# Patient Record
Sex: Female | Born: 1975 | Hispanic: Yes | Marital: Married | Smoking: Never smoker
Health system: Southern US, Community
[De-identification: ages and names within clinical notes are randomized; demographics above are authoritative.]

---

## 2009-11-24 ENCOUNTER — Inpatient Hospital Stay (HOSPITAL_COMMUNITY): Admission: AD | Admit: 2009-11-24 | Discharge: 2009-11-24 | Payer: Self-pay | Admitting: Obstetrics & Gynecology

## 2010-02-14 ENCOUNTER — Ambulatory Visit (HOSPITAL_COMMUNITY)
Admission: RE | Admit: 2010-02-14 | Discharge: 2010-02-14 | Payer: Self-pay | Source: Home / Self Care | Admitting: Family Medicine

## 2010-05-23 LAB — CBC
MCH: 30.4 pg (ref 26.0–34.0)
MCV: 89.9 fL (ref 78.0–100.0)
Platelets: 239 10*3/uL (ref 150–400)
RBC: 4.1 MIL/uL (ref 3.87–5.11)
RDW: 13.9 % (ref 11.5–15.5)
WBC: 12.7 10*3/uL — ABNORMAL HIGH (ref 4.0–10.5)

## 2010-05-23 LAB — WET PREP, GENITAL
Trich, Wet Prep: NONE SEEN
Yeast Wet Prep HPF POC: NONE SEEN

## 2010-05-23 LAB — URINE CULTURE
Colony Count: NO GROWTH
Culture: NO GROWTH

## 2010-05-23 LAB — GC/CHLAMYDIA PROBE AMP, GENITAL: Chlamydia, DNA Probe: NEGATIVE

## 2010-05-23 LAB — URINALYSIS, ROUTINE W REFLEX MICROSCOPIC
Bilirubin Urine: NEGATIVE
Glucose, UA: NEGATIVE mg/dL
Protein, ur: NEGATIVE mg/dL
Specific Gravity, Urine: 1.02 (ref 1.005–1.030)
Urobilinogen, UA: 0.2 mg/dL (ref 0.0–1.0)

## 2010-05-23 LAB — URINE MICROSCOPIC-ADD ON

## 2010-06-28 ENCOUNTER — Inpatient Hospital Stay (HOSPITAL_COMMUNITY): Admission: AD | Admit: 2010-06-28 | Payer: Self-pay | Source: Home / Self Care | Admitting: Obstetrics & Gynecology

## 2010-06-30 ENCOUNTER — Inpatient Hospital Stay (HOSPITAL_COMMUNITY)
Admission: AD | Admit: 2010-06-30 | Discharge: 2010-07-01 | DRG: 775 | Disposition: A | Discharge status: Home / Self Care | Payer: Managed Care, Other (non HMO) | Source: Ambulatory Visit | Attending: Obstetrics & Gynecology | Admitting: Obstetrics & Gynecology

## 2010-06-30 LAB — CBC
Hemoglobin: 10.3 g/dL — ABNORMAL LOW (ref 12.0–15.0)
MCHC: 31.1 g/dL (ref 30.0–36.0)
MCV: 80.9 fL (ref 78.0–100.0)
Platelets: 257 10*3/uL (ref 150–400)
WBC: 13.6 10*3/uL — ABNORMAL HIGH (ref 4.0–10.5)

## 2012-02-27 ENCOUNTER — Emergency Department (HOSPITAL_BASED_OUTPATIENT_CLINIC_OR_DEPARTMENT_OTHER)
Admission: EM | Admit: 2012-02-27 | Discharge: 2012-02-27 | Disposition: A | Discharge status: Home / Self Care | Payer: Worker's Compensation | Attending: Emergency Medicine | Admitting: Emergency Medicine

## 2012-02-27 ENCOUNTER — Encounter (HOSPITAL_BASED_OUTPATIENT_CLINIC_OR_DEPARTMENT_OTHER): Payer: Self-pay

## 2012-02-27 DIAGNOSIS — Y939 Activity, unspecified: Secondary | ICD-10-CM | POA: Insufficient documentation

## 2012-02-27 DIAGNOSIS — Z23 Encounter for immunization: Secondary | ICD-10-CM | POA: Insufficient documentation

## 2012-02-27 DIAGNOSIS — S1093XA Contusion of unspecified part of neck, initial encounter: Secondary | ICD-10-CM | POA: Insufficient documentation

## 2012-02-27 DIAGNOSIS — S0083XA Contusion of other part of head, initial encounter: Secondary | ICD-10-CM

## 2012-02-27 DIAGNOSIS — S0181XA Laceration without foreign body of other part of head, initial encounter: Secondary | ICD-10-CM

## 2012-02-27 DIAGNOSIS — Y990 Civilian activity done for income or pay: Secondary | ICD-10-CM | POA: Insufficient documentation

## 2012-02-27 DIAGNOSIS — Y9269 Other specified industrial and construction area as the place of occurrence of the external cause: Secondary | ICD-10-CM | POA: Insufficient documentation

## 2012-02-27 DIAGNOSIS — W1809XA Striking against other object with subsequent fall, initial encounter: Secondary | ICD-10-CM | POA: Insufficient documentation

## 2012-02-27 DIAGNOSIS — S0003XA Contusion of scalp, initial encounter: Secondary | ICD-10-CM | POA: Insufficient documentation

## 2012-02-27 DIAGNOSIS — W010XXA Fall on same level from slipping, tripping and stumbling without subsequent striking against object, initial encounter: Secondary | ICD-10-CM | POA: Insufficient documentation

## 2012-02-27 DIAGNOSIS — S0120XA Unspecified open wound of nose, initial encounter: Secondary | ICD-10-CM | POA: Insufficient documentation

## 2012-02-27 MED ORDER — TETANUS-DIPHTH-ACELL PERTUSSIS 5-2.5-18.5 LF-MCG/0.5 IM SUSP
0.5000 mL | Freq: Once | INTRAMUSCULAR | Status: AC
  Administered 2012-02-27: 0.5 mL via INTRAMUSCULAR
  Filled 2012-02-27: qty 0.5

## 2012-02-27 NOTE — ED Notes (Signed)
Pt fell at work striking face on floor.  Laceration to nose and hematoma to forehead.

## 2012-02-27 NOTE — ED Provider Notes (Addendum)
History     CSN: [id number]  Arrival date & time 02/27/12  1032   First MD Initiated Contact with Patient 02/27/12 1034      Chief Complaint  Patient presents with  . Fall  . Facial Laceration  . Head Injury    (Consider location/radiation/quality/duration/timing/severity/associated sxs/prior treatment) Patient is a 36 y.o. female presenting with fall and head injury. The history is provided by the patient.  Fall The accident occurred less than 1 hour ago. Incident: while at work she slipped and fell forward hiting her face on a shelf. She fell from a height of 1 to 2 ft. She landed on a hard floor. The volume of blood lost was minimal. Point of impact: face and forehead. Pain location: face and forehead. The pain is at a severity of 3/10. The pain is mild. She was ambulatory at the scene. Pertinent negatives include no visual change, no numbness, no nausea, no vomiting, no loss of consciousness and no tingling. The symptoms are aggravated by pressure on the injury. She has tried nothing for the symptoms. The treatment provided no relief.  Head Injury  Pertinent negatives include no numbness and no vomiting.    No past medical history on file.  No past surgical history on file.  No family history on file.  History  Substance Use Topics  . Smoking status: Not on file  . Smokeless tobacco: Not on file  . Alcohol Use: Not on file    OB History    No data available      Review of Systems  Gastrointestinal: Negative for nausea and vomiting.  Neurological: Negative for tingling, loss of consciousness and numbness.  All other systems reviewed and are negative.    Allergies  Review of patient's allergies indicates not on file.  Home Medications  No current outpatient prescriptions on file.  There were no vitals taken for this visit.  Physical Exam  Nursing note and vitals reviewed. Constitutional: She is oriented to person, place, and time. She appears  well-developed and well-nourished. No distress.  HENT:  Head: Normocephalic. Head is with contusion.    Nose: Nose lacerations present. No septal deviation or nasal septal hematoma.  Mouth/Throat: Oropharynx is clear and moist.  Eyes: Conjunctivae normal and EOM are normal. Pupils are equal, round, and reactive to light.  Neck: Normal range of motion. Neck supple. No spinous process tenderness and no muscular tenderness present.  Pulmonary/Chest: Effort normal. She exhibits no tenderness.  Abdominal: Soft. She exhibits no distension. There is no tenderness. There is no rebound and no guarding.  Musculoskeletal: Normal range of motion. She exhibits no edema and no tenderness.  Neurological: She is alert and oriented to person, place, and time.  Skin: Skin is warm and dry. No rash noted. No erythema.  Psychiatric: She has a normal mood and affect. Her behavior is normal.    ED Course  Procedures (including critical care time)  Labs Reviewed - No data to display No results found.  LACERATION REPAIR Performed by: Gwyneth Sprout Authorized byGwyneth Sprout Consent: Verbal consent obtained. Risks and benefits: risks, benefits and alternatives were discussed Consent given by: patient Patient identity confirmed: provided demographic data Prepped and Draped in normal sterile fashion Wound explored  Laceration Location: Nasal bridge  Laceration Length: 2 cm  No Foreign Bodies seen or palpated  Anesthesia: None   Irrigation method: syringe Amount of cleaning: standard  Skin closure: Dermabond   Number of sutures: 0   Technique: Dermabond  Patient tolerance: Patient tolerated the procedure well with no immediate complications.   1. Facial laceration   2. Facial hematoma       MDM   Patient with a mechanical fall at work with a laceration to her face. No LOC. Patient is otherwise healthy and takes no medications. She has no neck tenderness and her exam is  consistent with a nasal bridge laceration. No septal hematomas present and normal vision. Wound repaired with Dermabond. Tetanus updated        Gwyneth Sprout, MD 02/27/12 1052  Gwyneth Sprout, MD 02/27/12 1054

## 2012-02-27 NOTE — ED Notes (Signed)
Derma bond is at the bedside as the doctor gave a verbal order for. The bed is locked and in the lowest position. Ice pack has also been given.

## 2016-11-19 ENCOUNTER — Encounter (HOSPITAL_COMMUNITY): Payer: Self-pay

## 2017-12-25 ENCOUNTER — Other Ambulatory Visit (HOSPITAL_COMMUNITY): Payer: Self-pay | Admitting: *Deleted

## 2017-12-25 DIAGNOSIS — Z1231 Encounter for screening mammogram for malignant neoplasm of breast: Secondary | ICD-10-CM

## 2018-03-18 ENCOUNTER — Encounter (HOSPITAL_COMMUNITY): Payer: Self-pay

## 2018-03-18 ENCOUNTER — Ambulatory Visit
Admission: RE | Admit: 2018-03-18 | Discharge: 2018-03-18 | Disposition: A | Discharge status: Home / Self Care | Payer: No Typology Code available for payment source | Source: Ambulatory Visit | Attending: Obstetrics and Gynecology | Admitting: Obstetrics and Gynecology

## 2018-03-18 ENCOUNTER — Ambulatory Visit (HOSPITAL_COMMUNITY): Payer: Self-pay

## 2018-03-18 ENCOUNTER — Ambulatory Visit (HOSPITAL_COMMUNITY)
Admission: RE | Admit: 2018-03-18 | Discharge: 2018-03-18 | Disposition: A | Discharge status: Home / Self Care | Payer: Managed Care, Other (non HMO) | Source: Ambulatory Visit | Attending: Obstetrics and Gynecology | Admitting: Obstetrics and Gynecology

## 2018-03-18 VITALS — BP 112/70 | Wt 155.0 lb

## 2018-03-18 DIAGNOSIS — Z1239 Encounter for other screening for malignant neoplasm of breast: Secondary | ICD-10-CM

## 2018-03-18 DIAGNOSIS — Z1231 Encounter for screening mammogram for malignant neoplasm of breast: Secondary | ICD-10-CM

## 2018-03-18 NOTE — Patient Instructions (Signed)
Explained breast self awareness with [name]. Patient did not need a Pap smear today due to last Pap smear was around October-December 2019 per patient. Let her know [facility] will cover Pap smears every 3 years unless has a history of abnormal Pap smears. [facility] Appointment scheduled for Thursday, March 18, 2018 at 0910. Patient aware of appointment and will be there. Let patient know the Breast Center will follow up with her within the next couple weeks with results of mammogram by letter or phone. Lindsay Chavez verbalized understanding.  Ansen Sayegh, Kathaleen Maserhristine Poll, RN 8:27 AM

## 2018-03-18 NOTE — Progress Notes (Signed)
No complaints today.   Pap Smear: Pap smear not completed today. Last Pap smear was around October-December 2019 at the [facility] and normal per patient. Per patient has no history of an abnormal Pap smear. No Pap smear results are in Epic.   Physical exam: Breasts Breasts symmetrical. No skin abnormalities bilateral breasts. No nipple retraction bilateral breasts. No nipple discharge bilateral breasts. No lymphadenopathy. No lumps palpated bilateral breasts. No complaints of pain or tenderness on exam. Referred patient to the [facility] for a screening mammogram. Appointment scheduled for Thursday, March 18, 2018 at 0910.        No Pap smear completed today since last Pap smear was around October-December 2019.  Pap smear not indicated per [facility] guidelines.   Smoking History: Patient has never smoked.  Patient Navigation: Patient education provided. Access to services provided for patient through [facility] program. Spanish interpreter provided.   Breast and Cervical Cancer Risk Assessment: Patient has no family history of breast cancer, known genetic mutations, or radiation treatment to the chest before age 53. Patient has no history of cervical dysplasia, immunocompromised, or DES exposure in-utero.  Risk Assessment    Risk Scores      03/18/2018   Last edited by: Lynnell Dike, LPN   5-year risk: 0.3 %   Lifetime risk: 6.4 %         Used Spanish interpreter Natale Lay from [place].

## 2018-03-22 ENCOUNTER — Encounter (HOSPITAL_COMMUNITY): Payer: Self-pay | Admitting: *Deleted

## 2018-06-29 ENCOUNTER — Ambulatory Visit (HOSPITAL_COMMUNITY): Payer: Self-pay

## 2019-05-19 ENCOUNTER — Other Ambulatory Visit: Payer: Self-pay

## 2019-05-19 DIAGNOSIS — Z1231 Encounter for screening mammogram for malignant neoplasm of breast: Secondary | ICD-10-CM

## 2019-05-20 ENCOUNTER — Other Ambulatory Visit: Payer: Self-pay | Admitting: Nurse Practitioner

## 2019-05-20 DIAGNOSIS — Z1231 Encounter for screening mammogram for malignant neoplasm of breast: Secondary | ICD-10-CM

## 2019-06-16 ENCOUNTER — Ambulatory Visit
Admission: RE | Admit: 2019-06-16 | Discharge: 2019-06-16 | Disposition: A | Discharge status: Home / Self Care | Payer: No Typology Code available for payment source | Source: Ambulatory Visit | Attending: Nurse Practitioner | Admitting: Nurse Practitioner

## 2019-06-16 ENCOUNTER — Other Ambulatory Visit: Payer: Self-pay

## 2019-06-16 DIAGNOSIS — Z1231 Encounter for screening mammogram for malignant neoplasm of breast: Secondary | ICD-10-CM

## 2020-11-01 IMAGING — MG DIGITAL SCREENING BILAT W/ TOMO W/ CAD
8 series · 9 of 24 positions shown · non-contrast
Comparison: Previous exam(s).

CLINICAL DATA: Screening.

EXAM:
DIGITAL SCREENING BILATERAL MAMMOGRAM WITH TOMO AND CAD

[R MLO synth-2D]
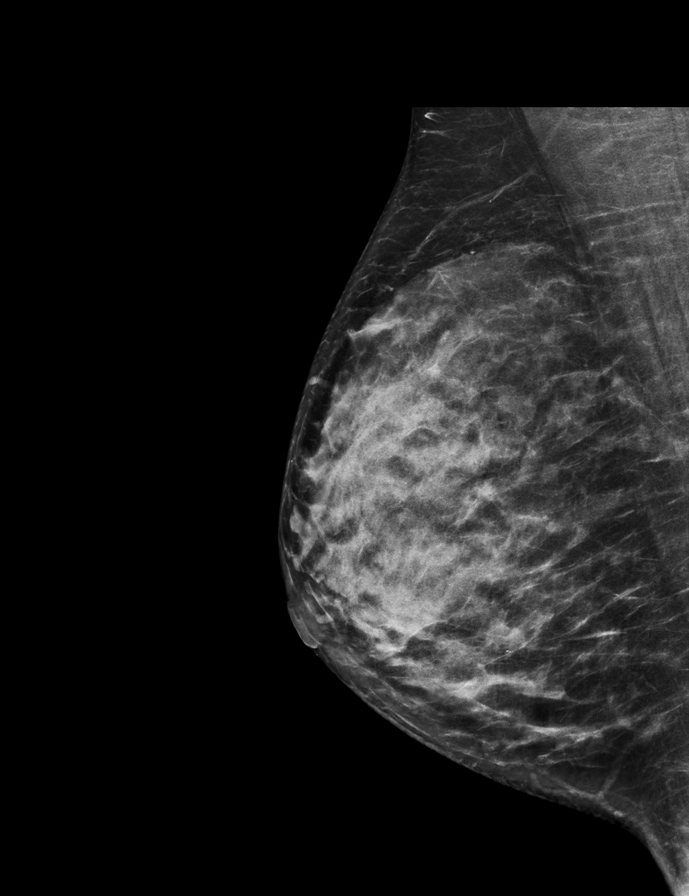

[L CC synth-2D]
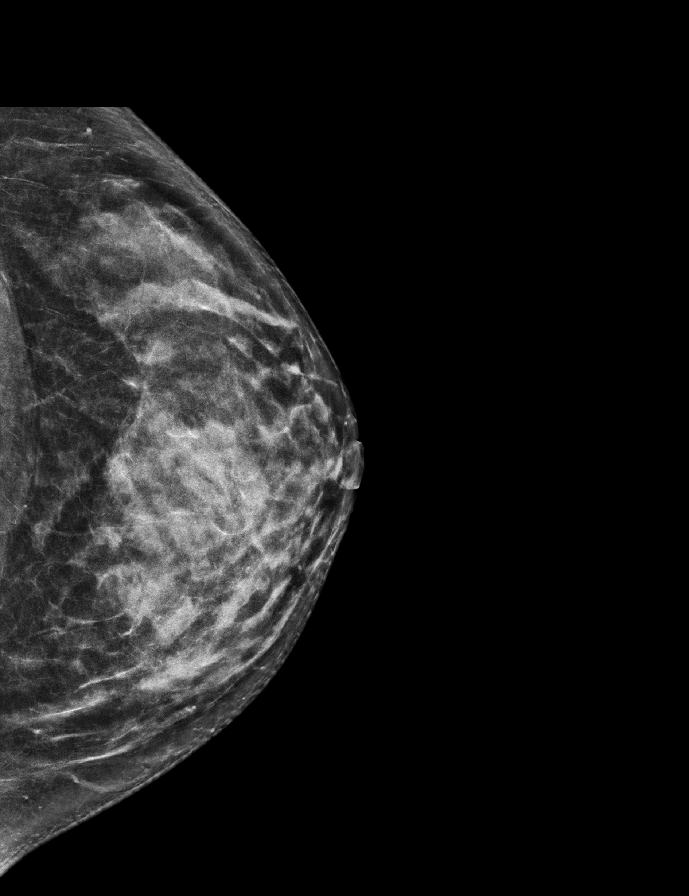

[L MLO synth-2D]
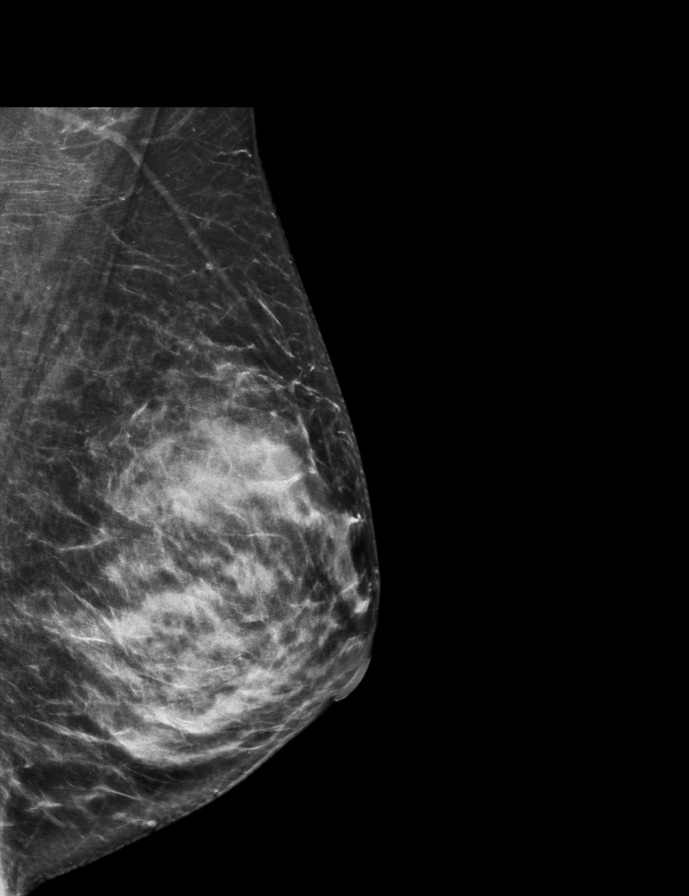

[R CC synth-2D]
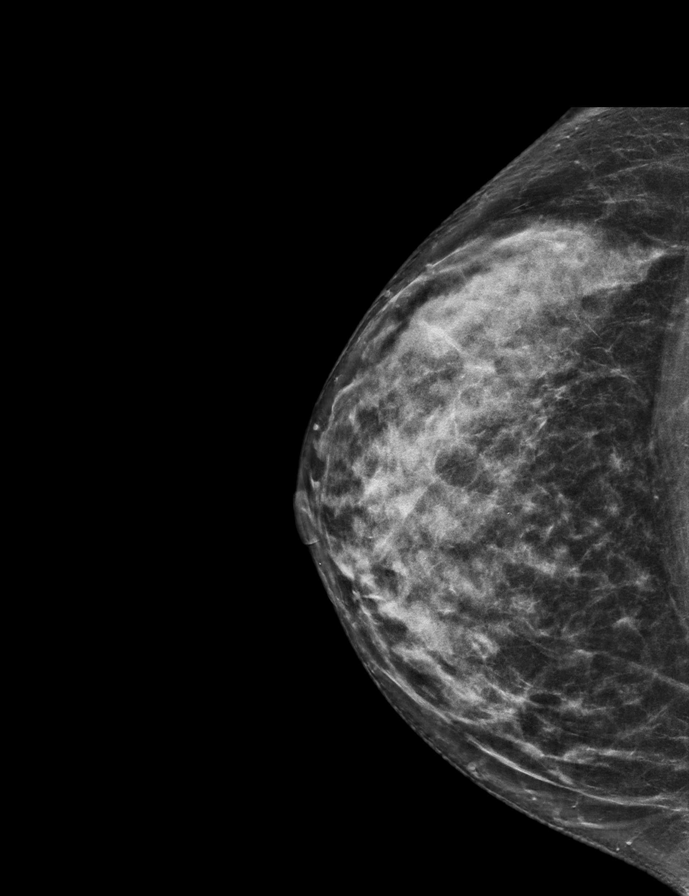

[L MLO tomo · 2 of 67 frames shown]
[frame 22/67]
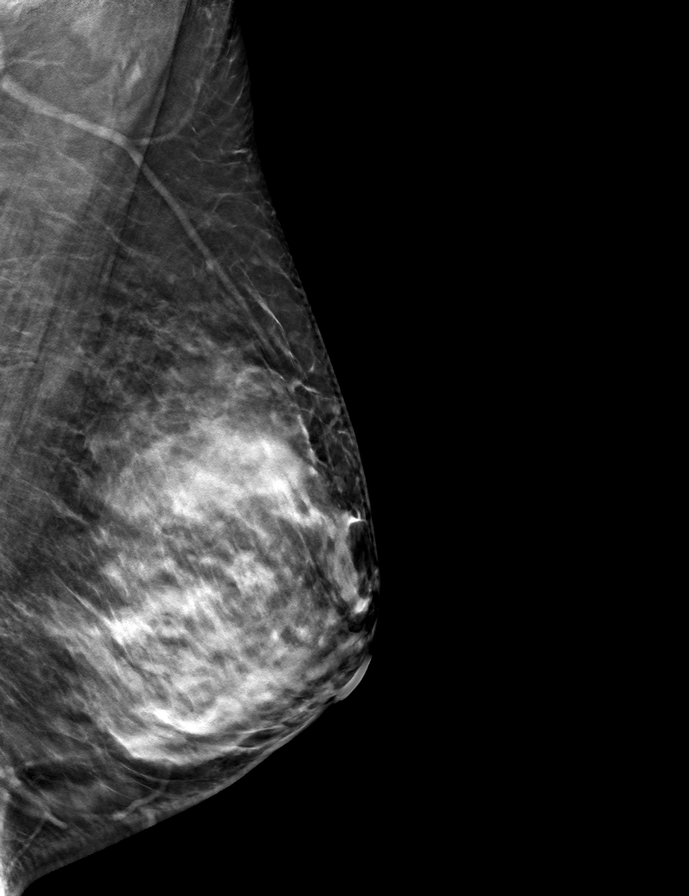
[frame 34/67]
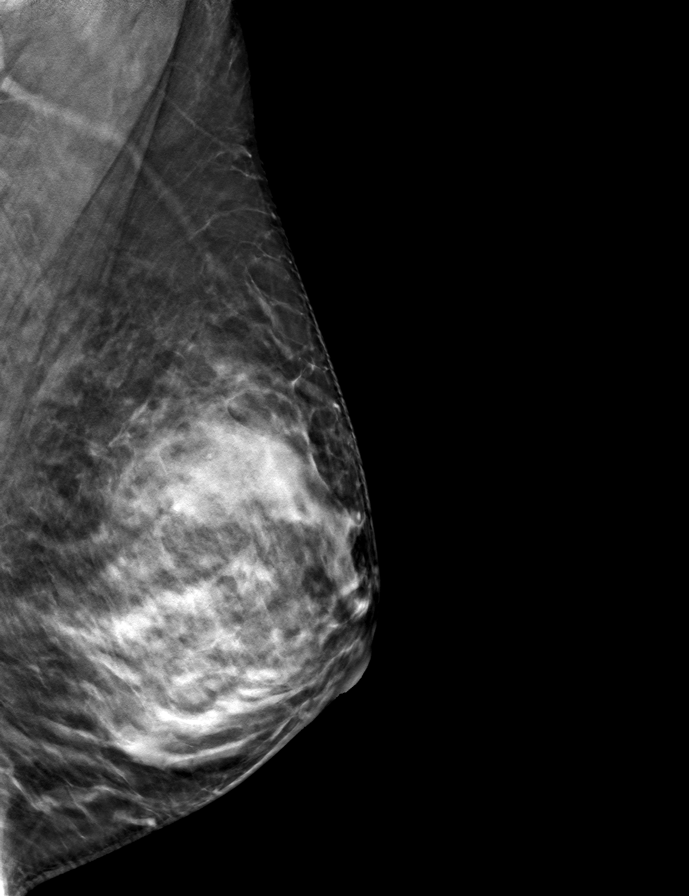

[R MLO tomo · tomo slice 33/66.0]
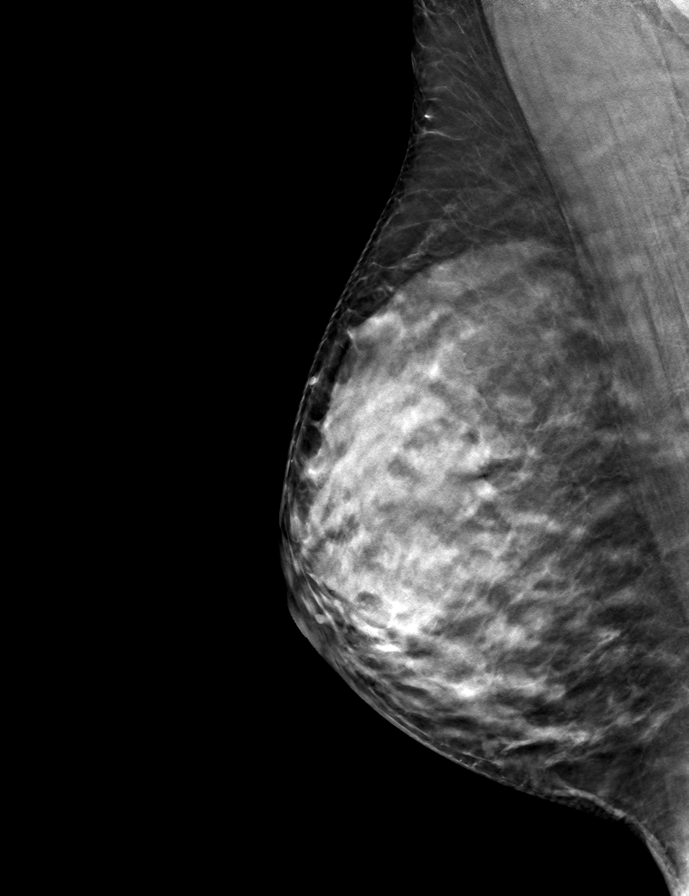

[L CC tomo · tomo slice 35/68.0]
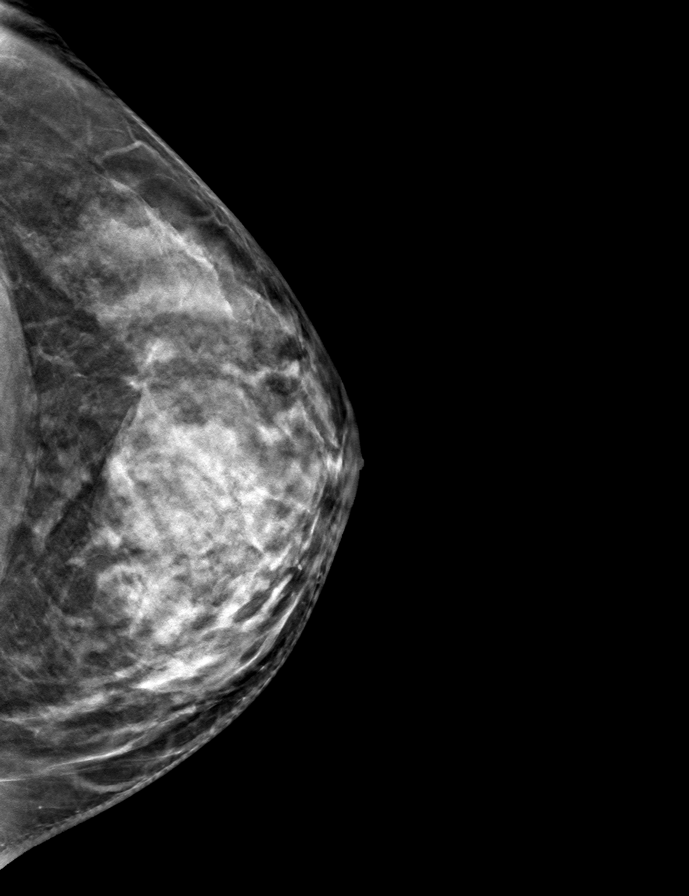

[R CC tomo · tomo slice 33/66.0]
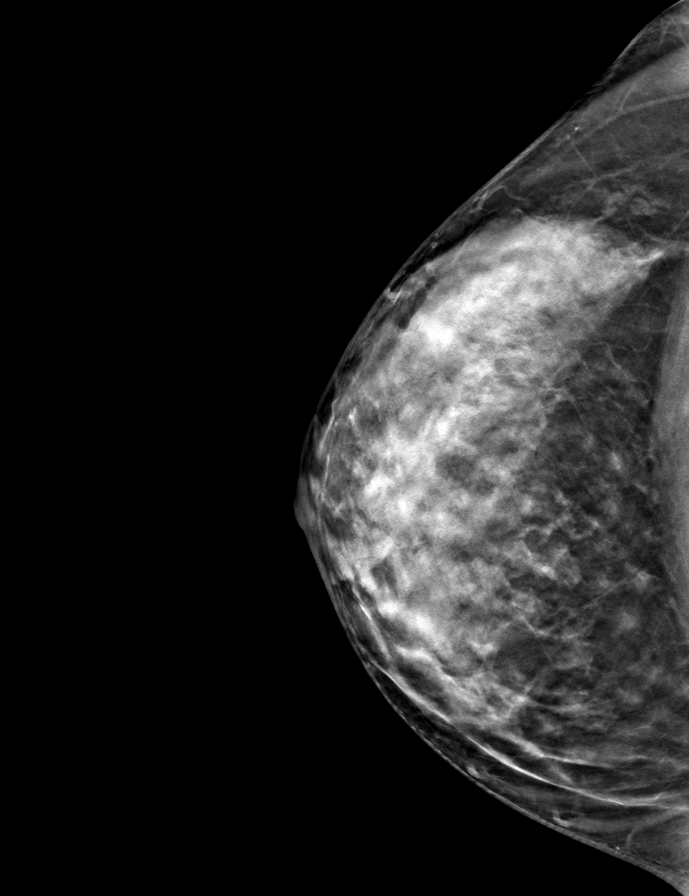

[9 of 24 positions shown; findings below may reference images not displayed]

ACR Breast Density Category c: The breast tissue is heterogeneously
dense, which may obscure small masses.
FINDINGS: There are no findings suspicious for malignancy. Images were
processed with CAD.
IMPRESSION: No mammographic evidence of malignancy. A result letter of this
screening mammogram will be mailed directly to the patient.

RECOMMENDATION:
Screening mammogram in one year. (Code:[id number])

BI-RADS CATEGORY  1: Negative.

## 2022-07-29 ENCOUNTER — Telehealth: Payer: Self-pay

## 2022-07-29 NOTE — Telephone Encounter (Signed)
Telephoned patient using interpreter#[id number].Left a voice message with [facility] (scholarship) contact information.

## 2022-08-19 ENCOUNTER — Telehealth: Payer: Self-pay | Admitting: *Deleted

## 2022-09-03 ENCOUNTER — Other Ambulatory Visit: Payer: Self-pay

## 2022-09-03 ENCOUNTER — Encounter: Payer: Self-pay | Admitting: Internal Medicine

## 2022-09-08 ENCOUNTER — Other Ambulatory Visit: Payer: Self-pay | Admitting: Family

## 2022-09-08 DIAGNOSIS — Z1231 Encounter for screening mammogram for malignant neoplasm of breast: Secondary | ICD-10-CM

## 2022-10-02 ENCOUNTER — Ambulatory Visit
Admission: RE | Admit: 2022-10-02 | Discharge: 2022-10-02 | Disposition: A | Discharge status: Home / Self Care | Payer: No Typology Code available for payment source | Source: Ambulatory Visit | Attending: Family | Admitting: Family

## 2022-10-02 DIAGNOSIS — Z1231 Encounter for screening mammogram for malignant neoplasm of breast: Secondary | ICD-10-CM

## 2023-08-13 ENCOUNTER — Other Ambulatory Visit: Payer: Self-pay | Admitting: Obstetrics & Gynecology

## 2023-08-13 DIAGNOSIS — Z1231 Encounter for screening mammogram for malignant neoplasm of breast: Secondary | ICD-10-CM

## 2023-10-15 ENCOUNTER — Ambulatory Visit
Admission: RE | Admit: 2023-10-15 | Discharge: 2023-10-15 | Disposition: A | Discharge status: Home / Self Care | Source: Ambulatory Visit | Attending: Obstetrics & Gynecology | Admitting: Obstetrics & Gynecology

## 2023-10-15 DIAGNOSIS — Z1231 Encounter for screening mammogram for malignant neoplasm of breast: Secondary | ICD-10-CM
